# Patient Record
Sex: Male | Born: 1990 | Race: White | Hispanic: No | Marital: Single | Smoking: Never smoker
Health system: Southern US, Community
[De-identification: ages and names within clinical notes are randomized; demographics above are authoritative.]

---
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
## 2011-03-14 IMAGING — CT CT HEAD W/O CM
2 series · 16 of 30 positions shown, 20 images · non-contrast
Comparison: 08/17/2009

CLINICAL DATA: Football injury, follow-up hematoma.

CT HEAD WITHOUT CONTRAST
TECHNIQUE: Contiguous axial images were obtained from the base of
the skull through the vertex without contrast.

[Series 2: head w/o · axial · non-contrast · 0.49mm/px · z∈[+14,+138]mm · 13 of 28 slices shown, 17 images]
[im 2/28  brain]
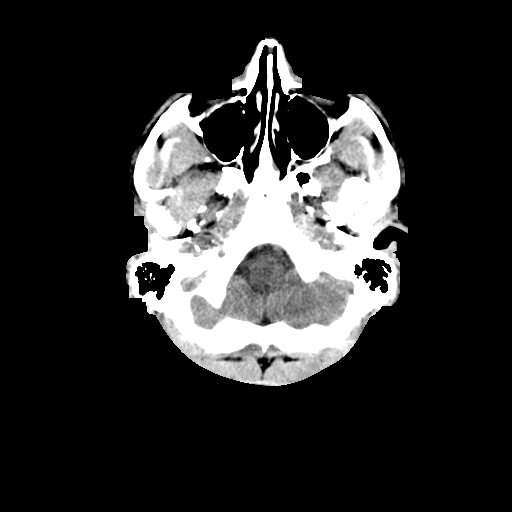
[im 2/28  bone]
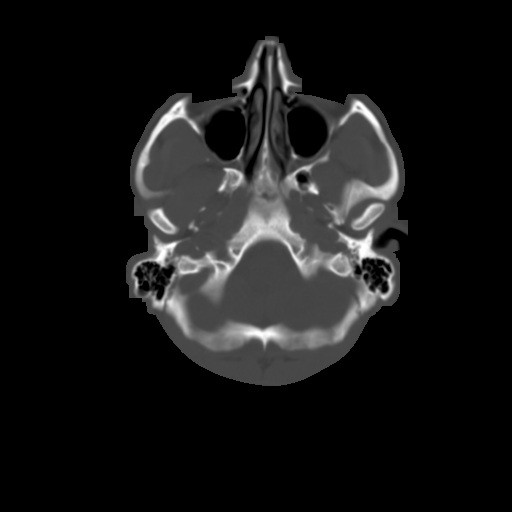
[im 4/28  brain]
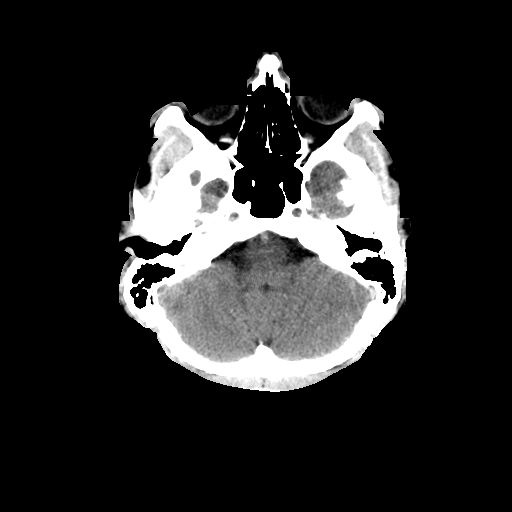
[im 6/28  brain]
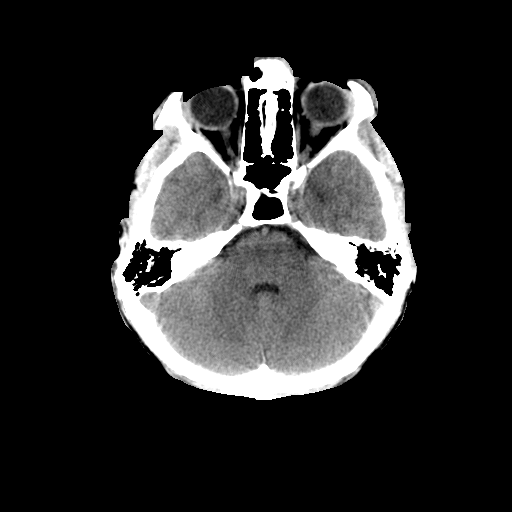
[im 8/28  brain]
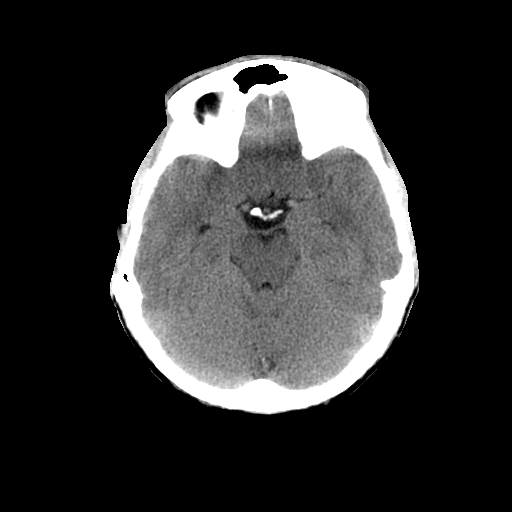
[im 10/28  brain]
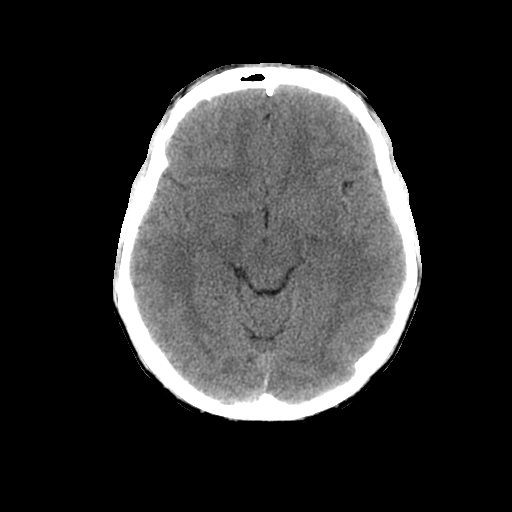
[im 10/28  bone]
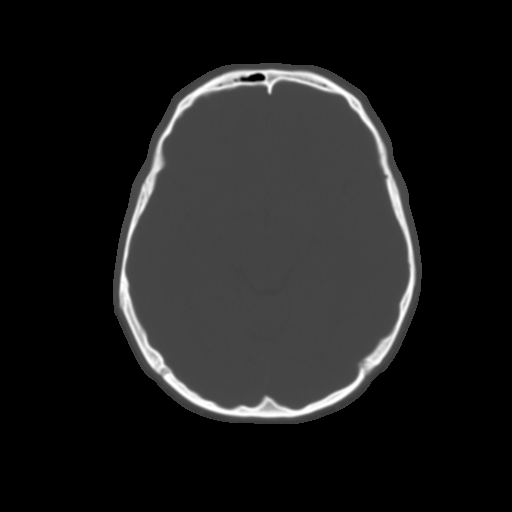
[im 12/28  brain]
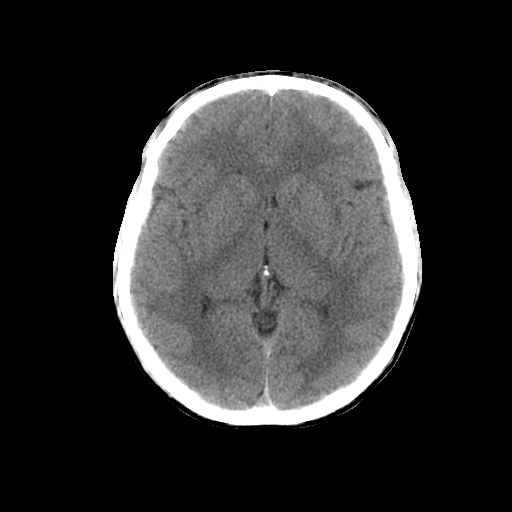
[im 14/28  brain]
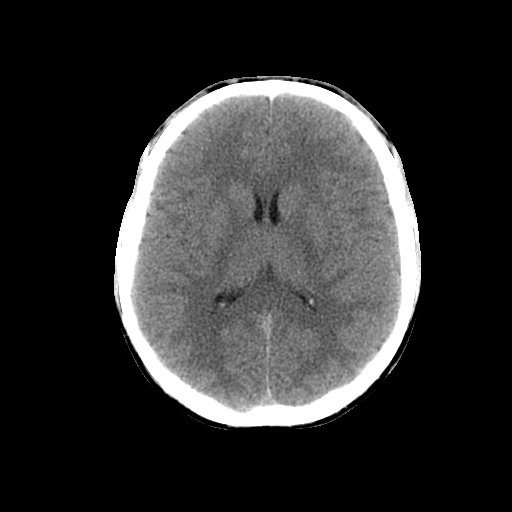
[im 16/28  brain]
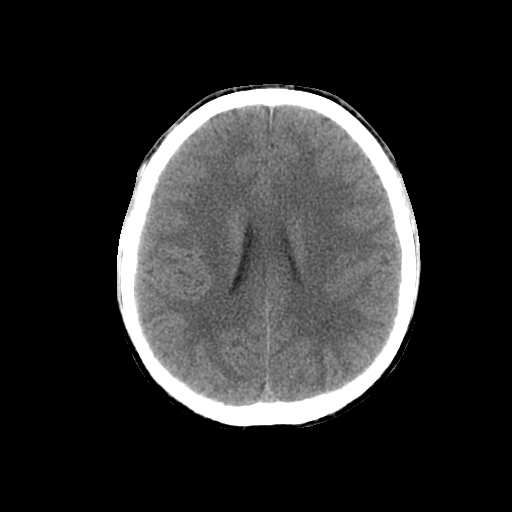
[im 18/28  brain]
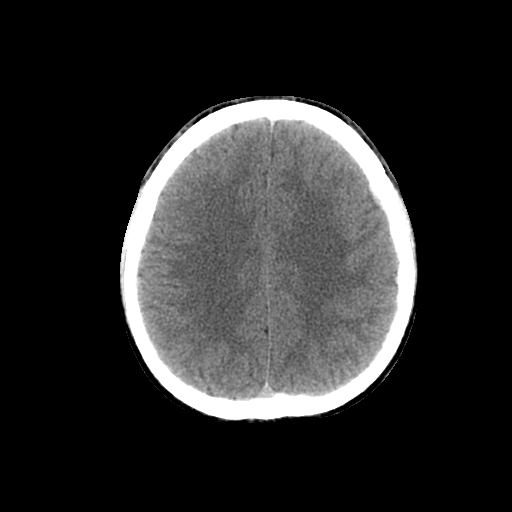
[im 18/28  bone]
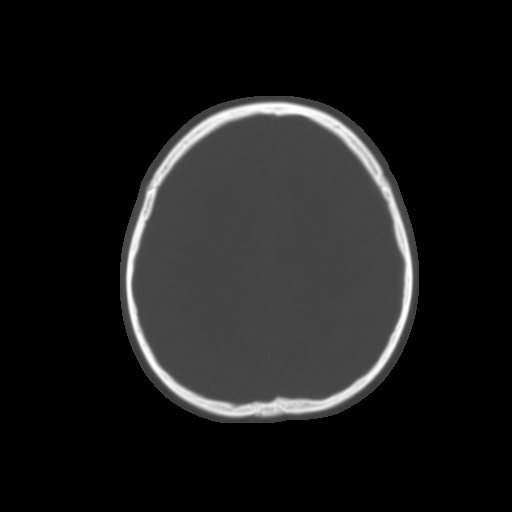
[im 20/28  brain]
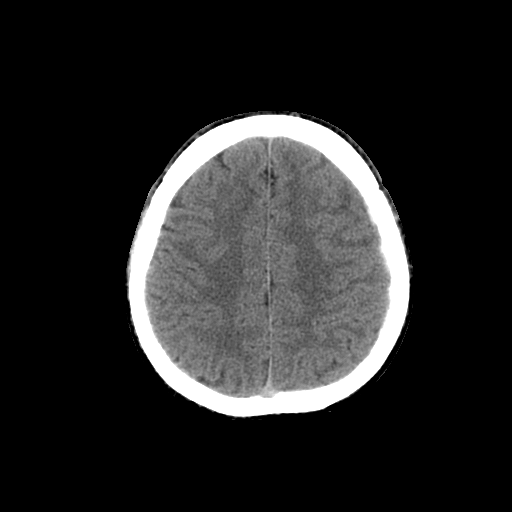
[im 22/28  brain]
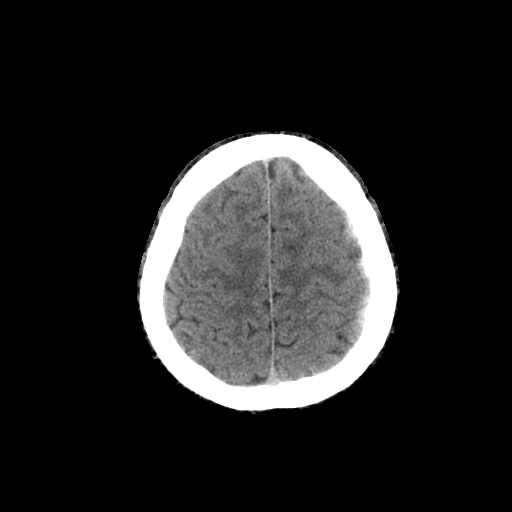
[im 24/28  brain]
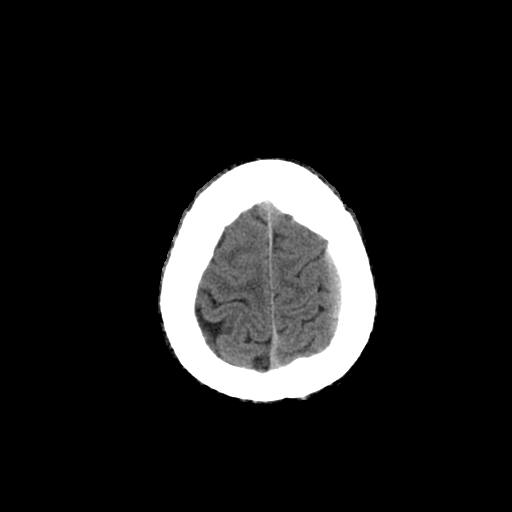
[im 26/28  brain]
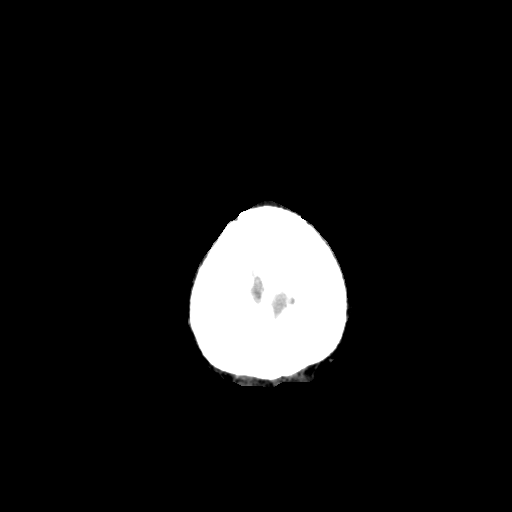
[im 26/28  bone]
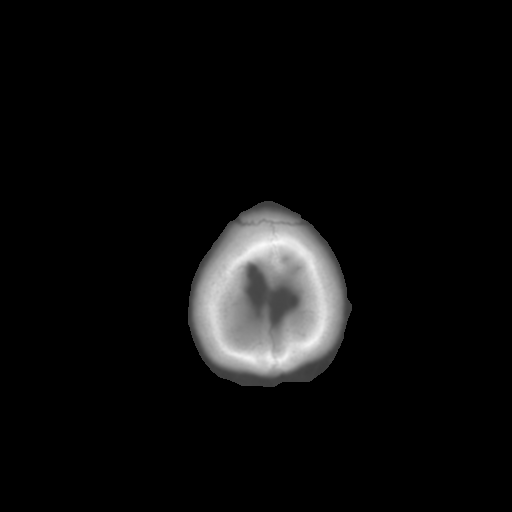

[Series 3: head bone · axial · 0.49mm/px · z∈[+14,+56]mm · 3 of 28 slices shown]
[im 2/28  bone]
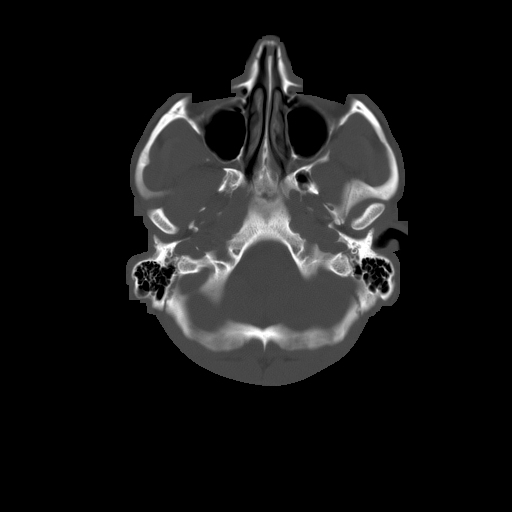
[im 6/28  bone]
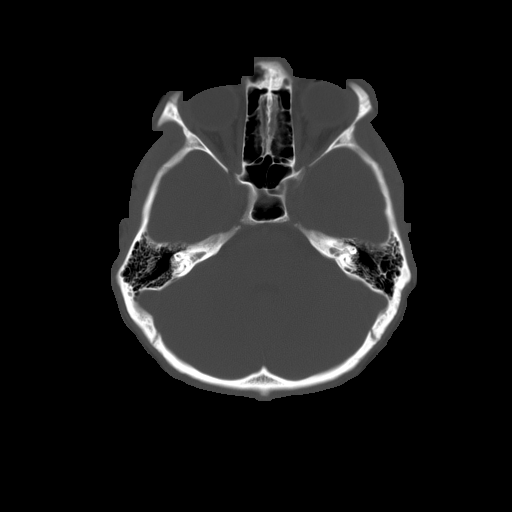
[im 10/28  bone]
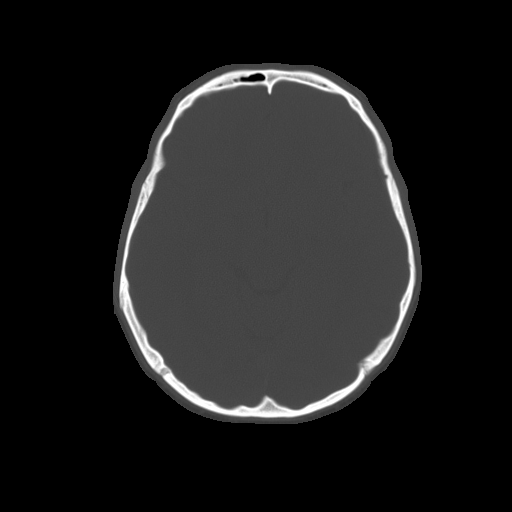

[16 of 30 positions shown; findings below may reference images not displayed]

FINDINGS: There likely continues to be a very small subdural
hematoma over the left cerebral hemisphere.  This is very difficult
to visualize on today's study and may be slightly smaller.  No new
areas of hemorrhage.  No mass effect midline shift.  No
hydrocephalus.  Calvarium is intact.
IMPRESSION: Stable or slight decrease size of the tiny left subdural hematoma.

## 2011-04-07 IMAGING — CT CT HEAD W/O CM
2 series · 16 of 30 positions shown, 20 images · non-contrast
Comparison: 08/19/2009

CLINICAL DATA: Football injury.  Subdural hematoma.

CT HEAD WITHOUT CONTRAST
TECHNIQUE: Contiguous axial images were obtained from the base of
the skull through the vertex without contrast.

[Series 2: head w/o · axial · non-contrast · 0.49mm/px · z∈[+31,+158]mm · 13 of 28 slices shown, 17 images]
[im 2/28  brain]
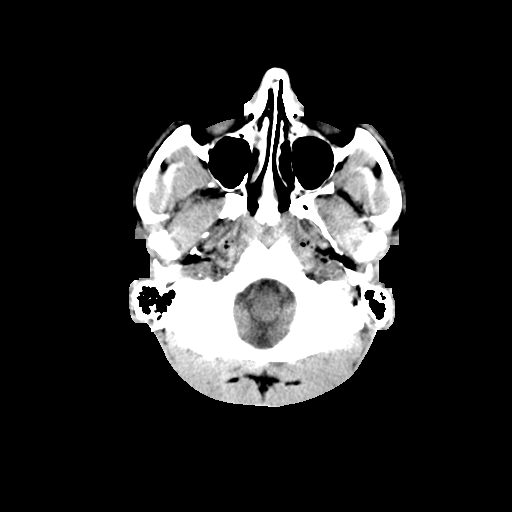
[im 2/28  bone]
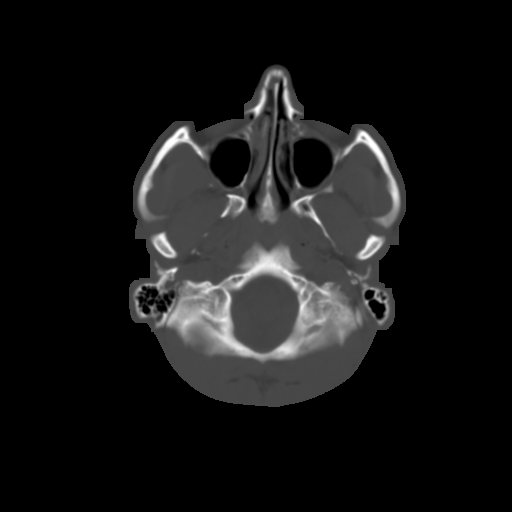
[im 4/28  brain]
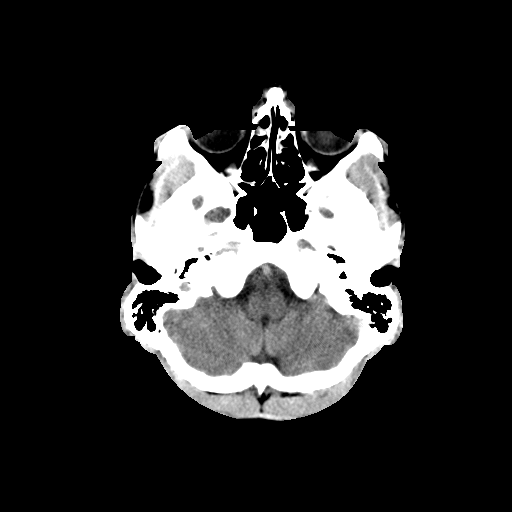
[im 6/28  brain]
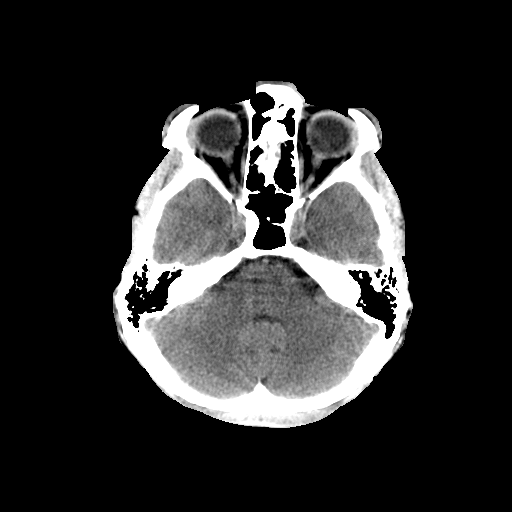
[im 8/28  brain]
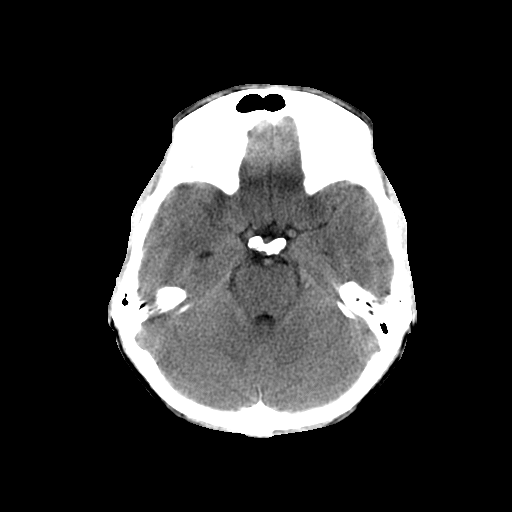
[im 10/28  brain]
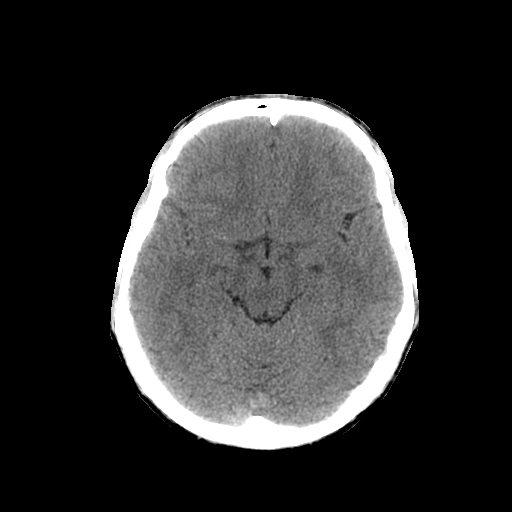
[im 10/28  bone]
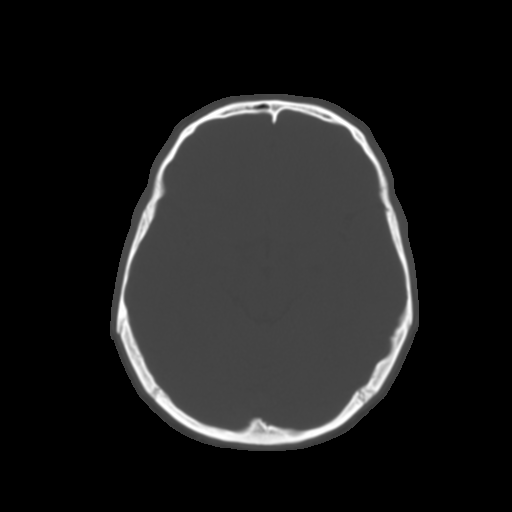
[im 12/28  brain]
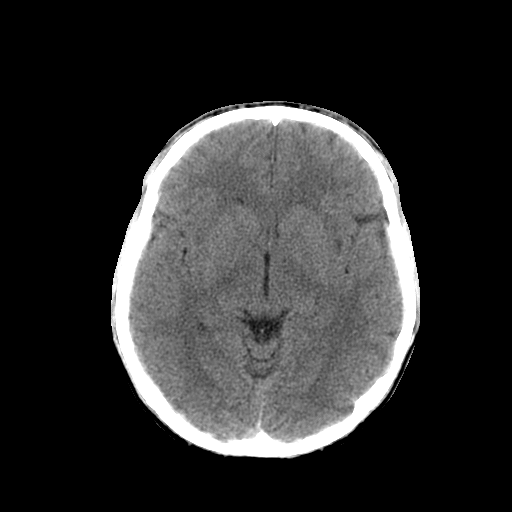
[im 14/28  brain]
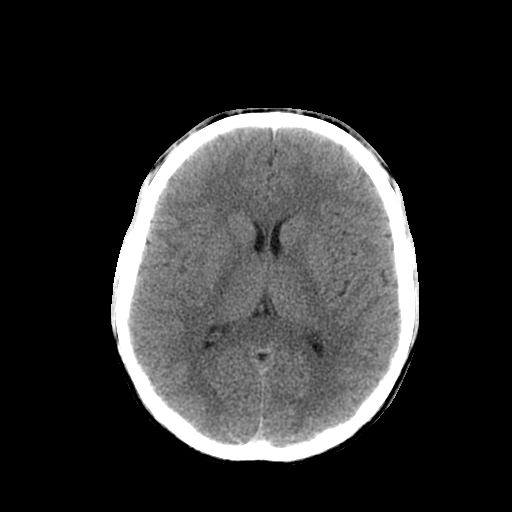
[im 16/28  brain]
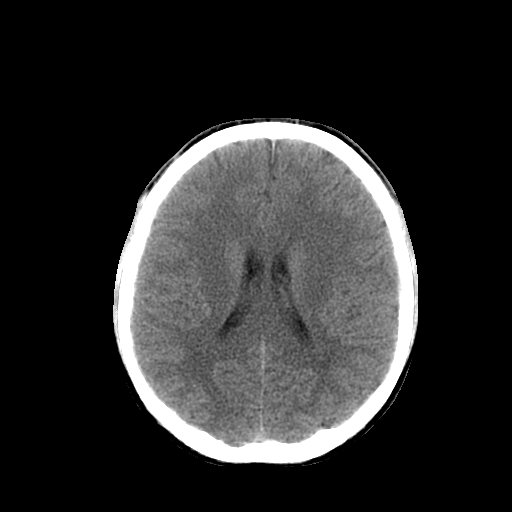
[im 18/28  brain]
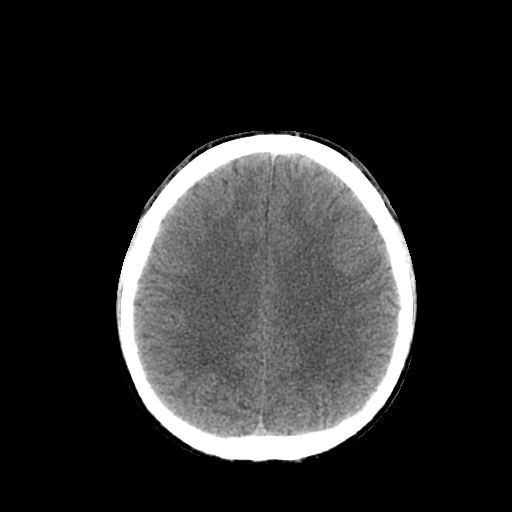
[im 18/28  bone]
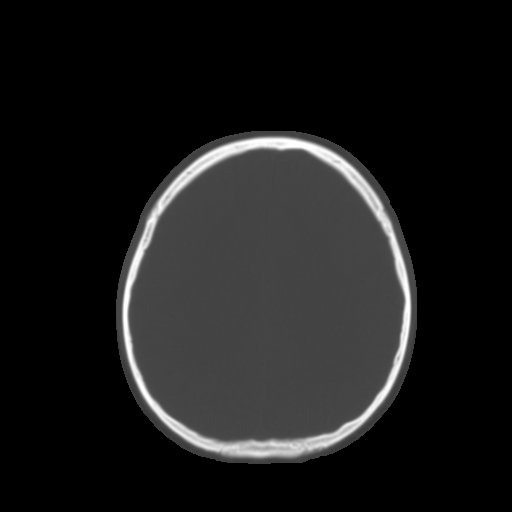
[im 20/28  brain]
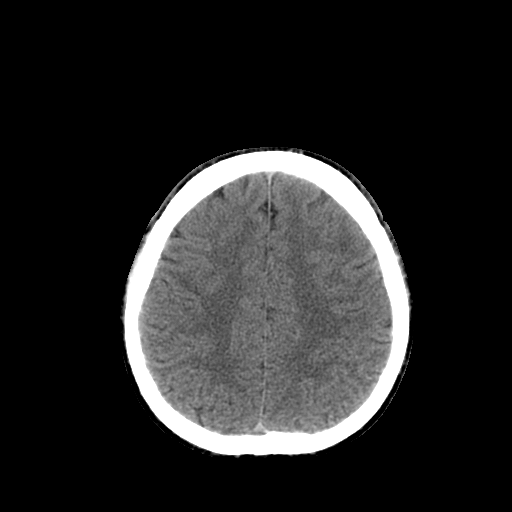
[im 22/28  brain]
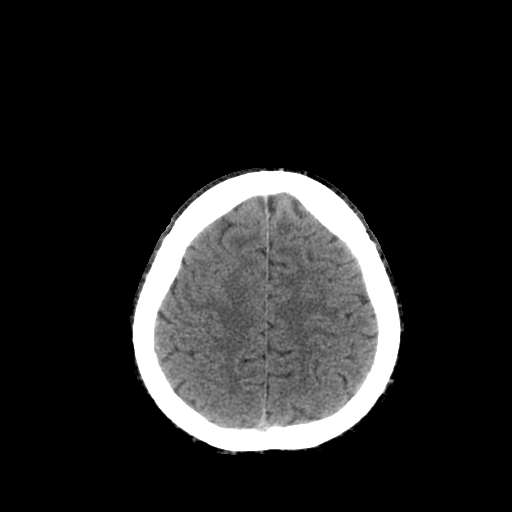
[im 24/28  brain]
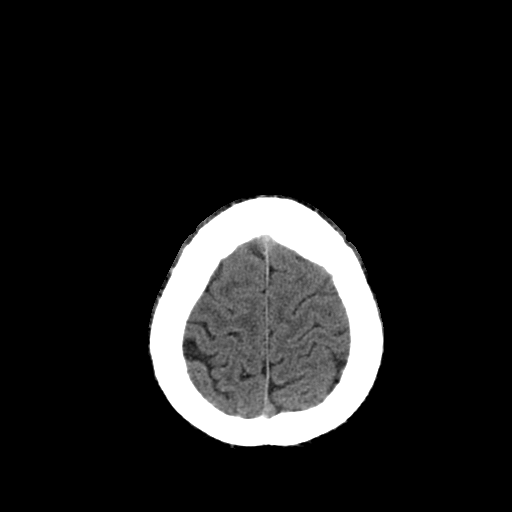
[im 26/28  brain]
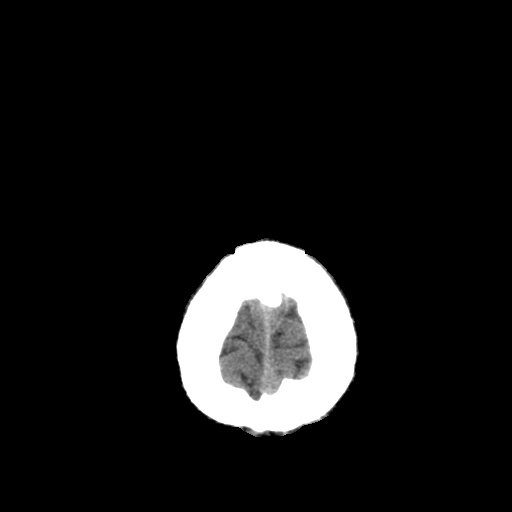
[im 26/28  bone]
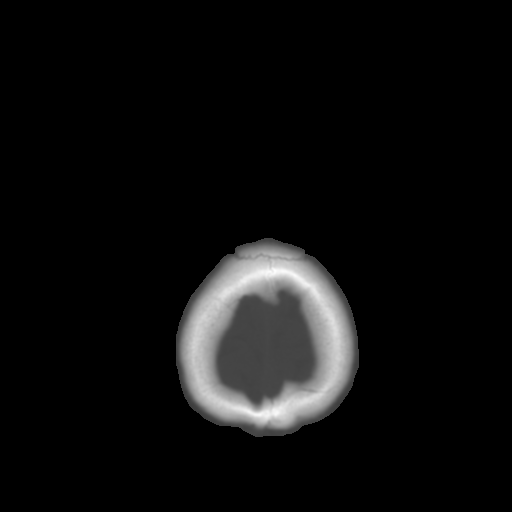

[Series 3: head bone · axial · 0.49mm/px · z∈[+31,+73]mm · 3 of 28 slices shown]
[im 2/28  bone]
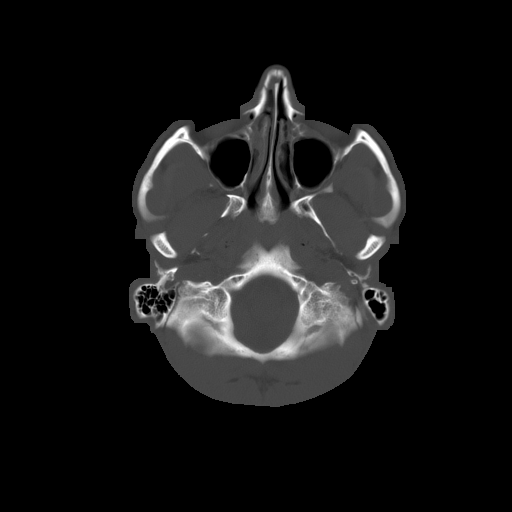
[im 6/28  bone]
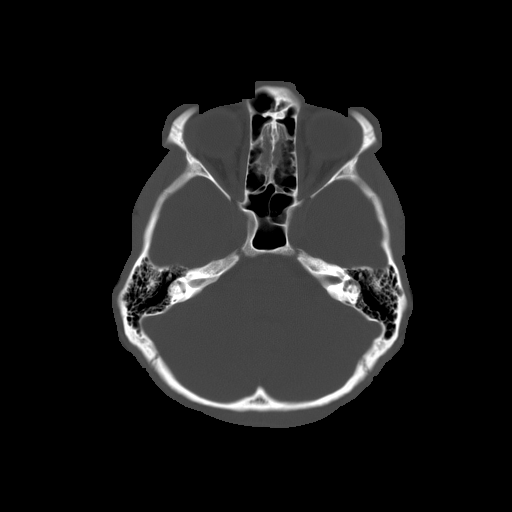
[im 10/28  bone]
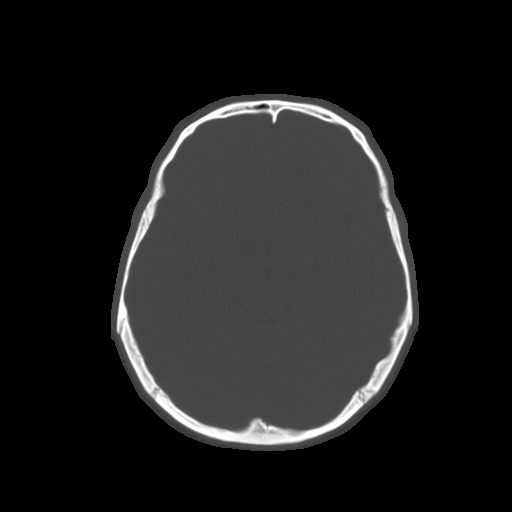

[16 of 30 positions shown; findings below may reference images not displayed]

FINDINGS: The previously described left subdural hematoma has
almost completely resolved in the interval.  There is a trace
residual amount of chronic subdural blood along the inferior left
temporal lobe, just posterior to the sylvian fissure (see image
12).  Otherwise, no subdural blood products are evident on today's
exam.

There is no evidence for hydrocephalus, mass/mass effect, or acute
ischemia.  Scattered mucosal disease is noted in the ethmoid air
cells.  The remaining visualized paranasal sinuses are clear.
IMPRESSION: Near complete resolution of the previously characterized left
subdural hematoma.

## 2012-06-27 ENCOUNTER — Encounter (HOSPITAL_COMMUNITY): Payer: Self-pay | Admitting: Family Medicine

## 2012-06-27 ENCOUNTER — Emergency Department (HOSPITAL_COMMUNITY)
Admission: EM | Admit: 2012-06-27 | Discharge: 2012-06-27 | Disposition: A | Discharge status: Home / Self Care | Payer: BC Managed Care – PPO | Attending: Emergency Medicine | Admitting: Emergency Medicine

## 2012-06-27 DIAGNOSIS — F1910 Other psychoactive substance abuse, uncomplicated: Secondary | ICD-10-CM | POA: Insufficient documentation

## 2012-06-27 DIAGNOSIS — X58XXXA Exposure to other specified factors, initial encounter: Secondary | ICD-10-CM | POA: Insufficient documentation

## 2012-06-27 DIAGNOSIS — F1410 Cocaine abuse, uncomplicated: Secondary | ICD-10-CM | POA: Insufficient documentation

## 2012-06-27 DIAGNOSIS — IMO0002 Reserved for concepts with insufficient information to code with codable children: Secondary | ICD-10-CM | POA: Insufficient documentation

## 2012-06-27 DIAGNOSIS — F1010 Alcohol abuse, uncomplicated: Secondary | ICD-10-CM | POA: Insufficient documentation

## 2012-06-27 NOTE — ED Notes (Signed)
Miller, MD at bedside.  

## 2012-06-27 NOTE — ED Notes (Signed)
Per [facility] [employer]. Difficult to arouse.

## 2012-06-27 NOTE — ED Notes (Signed)
Attempted several times to contact patient's mother, Martie Lee at [phone] without success.

## 2012-06-27 NOTE — ED Provider Notes (Signed)
History     CSN: [id number]  Arrival date & time 06/27/12  0323   First MD Initiated Contact with Patient 06/27/12 0327      Chief Complaint  Patient presents with  . Alcohol Intoxication    (Consider location/radiation/quality/duration/timing/severity/associated sxs/prior treatment) HPI Comments: 21 year old male presents by ambulance after the police officers found him in the grass behind some bushes, he had an abrasion to the right lower extremity but no signs of head trauma. The patient was able to say his name and continued to say the same year 1987 over and over and over. On my exam the patient states that he was drinking alcohol, doing cocaine and drinking rental. He denies any complaints at all and laughs throughout the exam. He denies any knowledge of how he got where he was  Patient is a 21 y.o. male presenting with intoxication. The history is provided by the patient and the EMS personnel. History Limited By: Level V caveat apply secondary to intoxication and altered mental status.  Alcohol Intoxication    History reviewed. No pertinent past medical history.  History reviewed. No pertinent past surgical history.  No family history on file.  History  Substance Use Topics  . Smoking status: Never Smoker   . Smokeless tobacco: Not on file  . Alcohol Use: Yes      Review of Systems  Unable to perform ROS: Mental status change    Allergies  Review of patient's allergies indicates no known allergies.  Home Medications  No current outpatient prescriptions on file.  BP 105/69  Pulse 76  Temp 98.4 F (36.9 C) (Oral)  Resp 18  SpO2 98%  Physical Exam  Nursing note and vitals reviewed. Constitutional: He appears well-developed and well-nourished. No distress.  HENT:  Head: Normocephalic and atraumatic.  Mouth/Throat: Oropharynx is clear and moist. No oropharyngeal exudate.  Eyes: Conjunctivae and EOM are normal. Pupils are equal, round, and reactive to  light. Right eye exhibits no discharge. Left eye exhibits no discharge. No scleral icterus.       Pupils are 6 mm bilaterally and reactive  Neck: Normal range of motion. Neck supple. No JVD present. No thyromegaly present.  Cardiovascular: Normal rate, regular rhythm, normal heart sounds and intact distal pulses.  Exam reveals no gallop and no friction rub.   No murmur heard. Pulmonary/Chest: Effort normal and breath sounds normal. No respiratory distress. He has no wheezes. He has no rales.  Abdominal: Soft. Bowel sounds are normal. He exhibits no distension and no mass. There is no tenderness.  Musculoskeletal: Normal range of motion. He exhibits no edema and no tenderness.  Lymphadenopathy:    He has no cervical adenopathy.  Neurological: He is alert. Coordination normal.  Skin: Skin is warm and dry.       Superficial abrasion to the right lower extremity below the knee anteriorly  Psychiatric: He has a normal mood and affect. His behavior is normal.    ED Course  Procedures (including critical care time)  Labs Reviewed  ETHANOL - Abnormal; Notable for the following:    Alcohol, Ethyl (B) 254 (*)     All other components within normal limits   No results found.   1. Substance abuse       MDM  The patient has no significant trauma other than a slight abrasion to the right lower extremity on the anterior shin. There is no joint trauma, no deformity, no head injury and the patient is able to respond  to my questions, he is able to sit up by himself and comply with my exam. His speech is slightly slurred and he has difficulty remembering the events of the evening but appears intoxicated consistent with his history of drinking and using cocaine this evening.  Pt is now more alert but still not given straight answers and evasive, occasionally giving some history - such as "someone gave me acid last night".   7:00 AM, patient reevaluated, ambulatory asking for a bathroom and for a  ride home, is now normal mental status and appears stable for discharge  Vida Roller, MD 06/27/12 [phone]

## 2018-02-24 ENCOUNTER — Encounter: Payer: Self-pay | Admitting: Emergency Medicine

## 2018-02-24 ENCOUNTER — Emergency Department
Admission: EM | Admit: 2018-02-24 | Discharge: 2018-02-24 | Disposition: A | Discharge status: Home / Self Care | Payer: Self-pay | Attending: Emergency Medicine | Admitting: Emergency Medicine

## 2018-02-24 ENCOUNTER — Other Ambulatory Visit: Payer: Self-pay

## 2018-02-24 DIAGNOSIS — Y929 Unspecified place or not applicable: Secondary | ICD-10-CM | POA: Insufficient documentation

## 2018-02-24 DIAGNOSIS — Y998 Other external cause status: Secondary | ICD-10-CM | POA: Insufficient documentation

## 2018-02-24 DIAGNOSIS — S61451A Open bite of right hand, initial encounter: Secondary | ICD-10-CM | POA: Insufficient documentation

## 2018-02-24 DIAGNOSIS — Y9389 Activity, other specified: Secondary | ICD-10-CM | POA: Insufficient documentation

## 2018-02-24 DIAGNOSIS — W540XXA Bitten by dog, initial encounter: Secondary | ICD-10-CM | POA: Insufficient documentation

## 2018-02-24 MED ORDER — AMOXICILLIN-POT CLAVULANATE 875-125 MG PO TABS
1.0000 | ORAL_TABLET | Freq: Once | ORAL | Status: AC
  Administered 2018-02-24: 1 via ORAL
  Filled 2018-02-24: qty 1

## 2018-02-24 MED ORDER — LIDOCAINE HCL (PF) 1 % IJ SOLN
INTRAMUSCULAR | Status: AC
  Filled 2018-02-24: qty 5

## 2018-02-24 MED ORDER — TETANUS-DIPHTH-ACELL PERTUSSIS 5-2.5-18.5 LF-MCG/0.5 IM SUSP
0.5000 mL | Freq: Once | INTRAMUSCULAR | Status: AC
  Administered 2018-02-24: 0.5 mL via INTRAMUSCULAR
  Filled 2018-02-24: qty 0.5

## 2018-02-24 MED ORDER — AMOXICILLIN-POT CLAVULANATE 875-125 MG PO TABS: 1.0000 | ORAL_TABLET | Freq: Two times a day (BID) | ORAL | 0 refills | Status: DC

## 2018-02-24 NOTE — ED Triage Notes (Signed)
Pt bitten by personal dog on the middle finger on right hand while trying to break up a fight between dogs over food. Pt lives in [place] city limits. Incident not reported. Dogs shots up to date.

## 2018-02-24 NOTE — ED Provider Notes (Signed)
[facility] Emergency Department Provider Note  ____________________________________________  Time seen: Approximately 8:11 PM  I have reviewed the triage vital signs and the nursing notes.   HISTORY  Chief Complaint Animal Bite    HPI Cody Donovan is a 27 y.o. male who presents emergency department complaining of dog bite to the third digit of his right hand.  Patient reports that his dogs were fighting over food, he reached in between them to break them up in 1 accidentally bit his third digit.  Patient reports that the bleeding was controlled with direct pressure.  He had full range of motion to the digit.  He is unsure of his tetanus shot.  No concern for rabies as animals are up-to-date on rabies vaccinations, have been acting normal, providing over the food and he introduced his hand in between them.  Patient declines rabies vaccines.  No other injury or complaint.  No medications prior to arrival.  History reviewed. No pertinent past medical history.  There are no active problems to display for this patient.   History reviewed. No pertinent surgical history.  Prior to Admission medications   Medication Sig Start Date End Date Taking? Authorizing Provider  amoxicillin-clavulanate (AUGMENTIN) 875-125 MG tablet Take 1 tablet by mouth 2 (two) times daily. 02/24/18   Jilliam Bellmore, Delorise Royals, PA-C    Allergies Patient has no known allergies.  No family history on file.  Social History Social History   Tobacco Use  . Smoking status: Never Smoker  . Smokeless tobacco: Never Used  Substance Use Topics  . Alcohol use: Yes  . Drug use: No     Review of Systems  Constitutional: No fever/chills Eyes: No visual changes.  Cardiovascular: no chest pain. Respiratory: no cough. No SOB. Gastrointestinal: No abdominal pain.  No nausea, no vomiting.   Musculoskeletal: Positive for dog bite to the third digit of the right hand Skin: Negative for rash,  abrasions, lacerations, ecchymosis. Neurological: Negative for headaches, focal weakness or numbness. 10-point ROS otherwise negative.  ____________________________________________   PHYSICAL EXAM:  VITAL SIGNS: ED Triage Vitals  Enc Vitals Group     BP 02/24/18 1949 140/88     Pulse Rate 02/24/18 1949 93     Resp 02/24/18 1949 18     Temp 02/24/18 1949 98.5 F (36.9 C)     Temp Source 02/24/18 1949 Oral     SpO2 02/24/18 1949 98 %     Weight 02/24/18 1950 175 lb (79.4 kg)     Height 02/24/18 1950  (1.727 m)     Head Circumference --      Peak Flow --      Pain Score 02/24/18 1950 4     Pain Loc --      Pain Edu? --      Excl. in GC? --      Constitutional: Alert and oriented. Well appearing and in no acute distress. Eyes: Conjunctivae are normal. PERRL. EOMI. Head: Atraumatic. Neck: No stridor.    Cardiovascular: Normal rate, regular rhythm. Normal S1 and S2.  Good peripheral circulation. Respiratory: Normal respiratory effort without tachypnea or retractions. Lungs CTAB. Good air entry to the bases with no decreased or absent breath sounds. Musculoskeletal: Full range of motion to all extremities. No gross deformities appreciated.  Jagged laceration noted to the palmar aspect of the third digit.  This is over the medial phalanx.  No bleeding.  No foreign body.  Full extension and flexion of the  digit.  Sensation and cap refill intact distally.  Laceration measures approximately 1 cm in length.  No other visible injury to the right hand. Neurologic:  Normal speech and language. No gross focal neurologic deficits are appreciated.  Skin:  Skin is warm, dry and intact. No rash noted. Psychiatric: Mood and affect are normal. Speech and behavior are normal. Patient exhibits appropriate insight and judgement.   ____________________________________________   LABS (all labs ordered are listed, but only abnormal results are displayed)  Labs Reviewed - No data to  display ____________________________________________  EKG   ____________________________________________  RADIOLOGY   No results found.  ____________________________________________    PROCEDURES  Procedure(s) performed:    Marland KitchenMarland KitchenLaceration Repair Date/Time: 02/24/2018 8:30 PM Performed by: Racheal Patches, PA-C Authorized by: Racheal Patches, PA-C   Consent:    Consent obtained:  Verbal   Consent given by:  Patient   Risks discussed:  Infection, pain, poor cosmetic result and poor wound healing Anesthesia (see MAR for exact dosages):    Anesthesia method:  None Repair type:    Repair type:  Simple Exploration:    Hemostasis achieved with:  Direct pressure   Wound exploration: wound explored through full range of motion and entire depth of wound probed and visualized     Wound extent: no foreign bodies/material noted, no muscle damage noted, no nerve damage noted, no tendon damage noted, no underlying fracture noted and no vascular damage noted     Contaminated: no   Treatment:    Area cleansed with:  Betadine   Amount of cleaning:  Extensive   Irrigation solution:  Sterile saline   Irrigation volume:  500 ml   Irrigation method:  Syringe Approximation:    Approximation:  Loose Post-procedure details:    Dressing:  Sterile dressing and splint for protection Comments:     Patient presented to the emergency department with a dog bite to the right third digit.  No attempts at closure, however extensive cleansing using Betadine soaks, sterile saline.      Medications  lidocaine (PF) (XYLOCAINE) 1 % injection (has no administration in time range)  Tdap (BOOSTRIX) injection 0.5 mL (has no administration in time range)  amoxicillin-clavulanate (AUGMENTIN) 875-125 MG per tablet 1 tablet (has no administration in time range)     ____________________________________________   INITIAL IMPRESSION / ASSESSMENT AND PLAN / ED COURSE  Pertinent labs &  imaging results that were available during my care of the patient were reviewed by me and considered in my medical decision making (see chart for details).  Review of the [place] CSRS was performed in accordance of the [facility] prior to dispensing any controlled drugs.     Patient's diagnosis is consistent with dog bite to the third digit of the right hand.  Patient presents after accidental bite by his own animals.  No concern for rabies.  Tetanus shot is updated at this time.  Wound is thoroughly cleansed, dressed, splint for protection.  Wound care instructions provided to patient.. Patient will be discharged home with prescriptions for Augmentin. Patient is to follow up with primary care as needed or otherwise directed. Patient is given ED precautions to return to the ED for any worsening or new symptoms.     ____________________________________________  FINAL CLINICAL IMPRESSION(S) / ED DIAGNOSES  Final diagnoses:  Dog bite, initial encounter      NEW MEDICATIONS STARTED DURING THIS VISIT:  ED Discharge Orders        Ordered  amoxicillin-clavulanate (AUGMENTIN) 875-125 MG tablet  2 times daily     02/24/18 2048          This chart was dictated using voice recognition software/Dragon. Despite best efforts to proofread, errors can occur which can change the meaning. Any change was purely unintentional.    Racheal Patches, PA-C 02/24/18 2050    Minna Antis, MD 02/24/18 2235

## 2021-08-21 ENCOUNTER — Ambulatory Visit: Payer: 59 | Admitting: Podiatry

## 2021-08-21 ENCOUNTER — Other Ambulatory Visit: Payer: Self-pay

## 2021-08-21 DIAGNOSIS — L603 Nail dystrophy: Secondary | ICD-10-CM | POA: Diagnosis not present

## 2021-08-21 NOTE — Patient Instructions (Signed)

## 2021-08-21 NOTE — Progress Notes (Signed)
  Subjective:  Patient ID: Cody Donovan, male    DOB: 1991/07/12,  MRN: [id number]  Chief Complaint  Patient presents with   Nail Problem    Lt hallux discoloratin x 3 yrs - no swelling but with redness, loose and sensitive -pt has smashed toenail tx: none    30 y.o. male presents with the above complaint. History confirmed with patient.  States that about 2 years ago a washer machine fell on the toe and the nail has grown differently ever since.  Objective:  Physical Exam: warm, good capillary refill, no trophic changes or ulcerative lesions, normal DP and PT pulses, and normal sensory exam.  Left hallux nail with transverse ridging, dystrophy, subungual debris, malodor Assessment:   1. Nail dystrophy      Plan:  Patient was evaluated and treated and all questions answered.  Onychodystrophy -Educated on etiology. -Nails debrided in length and thickness down to healthier looking nail.  Subungual debris cleared.  Advised to await further nail growth follow-up should he not be pleased with    No follow-ups on file.
